# Patient Record
Sex: Male | Born: 1995 | Race: White | Hispanic: No | Marital: Married | Smoking: Current every day smoker
Health system: Southern US, Community
[De-identification: ages and names within clinical notes are randomized; demographics above are authoritative.]

## PROBLEM LIST (undated history)

## (undated) DIAGNOSIS — F32A Depression, unspecified: Secondary | ICD-10-CM

## (undated) DIAGNOSIS — F329 Major depressive disorder, single episode, unspecified: Secondary | ICD-10-CM

## (undated) DIAGNOSIS — T7840XA Allergy, unspecified, initial encounter: Secondary | ICD-10-CM

## (undated) HISTORY — PX: TONSILLECTOMY: SUR1361

## (undated) HISTORY — DX: Major depressive disorder, single episode, unspecified: F32.9

## (undated) HISTORY — DX: Allergy, unspecified, initial encounter: T78.40XA

## (undated) HISTORY — DX: Depression, unspecified: F32.A

---
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
## 2016-06-24 ENCOUNTER — Ambulatory Visit (INDEPENDENT_AMBULATORY_CARE_PROVIDER_SITE_OTHER): Payer: No Typology Code available for payment source | Admitting: Family Medicine

## 2016-06-24 VITALS — BP 110/72 | HR 82 | Temp 98.4°F | Resp 18 | Ht 70.65 in | Wt 188.0 lb

## 2016-06-24 DIAGNOSIS — Z23 Encounter for immunization: Secondary | ICD-10-CM

## 2016-06-24 DIAGNOSIS — N451 Epididymitis: Secondary | ICD-10-CM | POA: Diagnosis not present

## 2016-06-24 DIAGNOSIS — F17200 Nicotine dependence, unspecified, uncomplicated: Secondary | ICD-10-CM | POA: Diagnosis not present

## 2016-06-24 MED ORDER — AZITHROMYCIN 250 MG PO TABS: 1000.0000 mg | ORAL_TABLET | Freq: Once | ORAL | 0 refills | Status: AC

## 2016-06-24 NOTE — Progress Notes (Signed)
    Charles SheffieldBenjamin A Cisneros is a 20 y.o. male who presents to [facility] today for testicle pain. Patient is a several day history of right testicle pain. No fevers chills nausea vomiting or diarrhea. Patient denies any penile discharge. He denies any unprotected sex but does note he had rough sex a few days before the pain started. He feels well otherwise. No treatment tried yet.   Past Medical History:  Diagnosis Date  . Allergy   . Depression    Past Surgical History:  Procedure Laterality Date  . TONSILLECTOMY     Social History  Substance Use Topics  . Smoking status: Light Tobacco Smoker  . Smokeless tobacco: Never Used  . Alcohol use Yes   ROS as above Medications: Current Outpatient Prescriptions  Medication Sig Dispense Refill  . azithromycin (ZITHROMAX) 250 MG tablet Take 4 tablets (1,000 mg total) by mouth once. Take first 2 tablets together, then 1 every day until finished. 4 tablet 0   No current facility-administered medications for this visit.    Allergies  Allergen Reactions  . Amoxicillin   . Penicillins   . Cefpodoxime Rash     Exam:  BP 110/72 (BP Location: Right Arm, Patient Position: Sitting, Cuff Size: Small)   Pulse 82   Temp 98.4 F (36.9 C) (Oral)   Resp 18   Ht 5' 10.65" (1.795 m)   Wt 188 lb (85.3 kg)   SpO2 100%   BMI 26.48 kg/m  Gen: Well NAD HEENT: EOMI,  MMM Lungs: Normal work of breathing. CTABL Heart: RRR no MRG Abd: NABS, Soft. Nondistended, Nontender Exts: Brisk capillary refill, warm and well perfused.  Genitals: No inguinal lymphadenopathy. Testicles are descended bilaterally. The right testicle is nontender with no masses. The left testicle is nontender with no masses. The right epididymis is slightly swollen and tender. The left is nontender. No inguinal hernias are present bilaterally. The penis is circumcised with no lesions or discharge.  No results found for this or any previous visit (from the past 24 hour(s)). No results  found.  Assessment and Plan: 20 y.o. male with epididymitis. Patient is allergic to both penicillins and cephalosporins. GC chlamydia urine test pending. Impaired treatment with amoxicillin 1 g by mouth once.  Additionally patient is a smoker. We discussed smoking cessation.  Orders Placed This Encounter  Procedures  . GC/Chlamydia Probe Amp    Order Specific Question:   Source    Answer:   urine  . Tdap vaccine greater than or equal to 7yo IM     Discussed warning signs or symptoms. Please see discharge instructions. Patient expresses understanding.

## 2016-06-24 NOTE — Patient Instructions (Addendum)
Thank you for coming in today. Return for recheck if needed.   Follow up with me Dr Docia Chuckorey   [facility] Address: [address], [place], [place] [address] Phone: [phone]   Epididymitis Epididymitis is swelling (inflammation) of the epididymis. The epididymis is a cord-like structure that is located along the top and back part of the testicle. It collects and stores sperm from the testicle. This condition can also cause pain and swelling of the testicle and scrotum. Symptoms usually start suddenly (acute epididymitis). Sometimes epididymitis starts gradually and lasts for a while (chronic epididymitis). This type may be harder to treat. CAUSES In men 8535 and younger, this condition is usually caused by a bacterial infection or sexually transmitted disease (STD), such as:  Gonorrhea.  Chlamydia.  In men 4835 and older who do not have anal sex, this condition is usually caused by bacteria from a blockage or abnormalities in the urinary system. These can result from:  Having a tube placed into the bladder (urinary catheter).  Having an enlarged or inflamed prostate gland.  Having recent urinary tract surgery. In men who have a condition that weakens the body's defense system (immune system), such as HIV, this condition can be caused by:   Other bacteria, including tuberculosis and syphilis.  Viruses.  Fungi. Sometimes this condition occurs without infection. That may happen if urine flows backward into the epididymis after heavy lifting or straining. RISK FACTORS This condition is more likely to develop in men:  Who have unprotected sex with more than one partner.  Who have anal sex.   Who have recently had surgery.   Who have a urinary catheter.  Who have urinary problems.  Who have a suppressed immune system. SYMPTOMS  This condition usually begins suddenly with chills, fever, and pain behind the scrotum and in the testicle. Other symptoms  include:   Swelling of the scrotum, testicle, or both.  Pain whenejaculatingor urinating.  Pain in the back or belly.  Nausea.  Itching and discharge from the penis.  Frequent need to pass urine.  Redness and tenderness of the scrotum. DIAGNOSIS Your health care provider can diagnose this condition based on your symptoms and medical history. Your health care provider will also do a physical exam to ask about your symptoms and check your scrotum and testicle for swelling, pain, and redness. You may also have other tests, including:   Examination of discharge from the penis.  Urine tests for infections, such as STDs.  Your health care provider may test you for other STDs, including HIV. TREATMENT Treatment for this condition depends on the cause. If your condition is caused by a bacterial infection, oral antibiotic medicine may be prescribed. If the bacterial infection has spread to your blood, you may need to receive IV antibiotics. Nonbacterial epididymitis is treated with home care that includes bed rest and elevation of the scrotum. Surgery may be needed to treat:  Bacterial epididymitis that causes pus to build up in the scrotum (abscess).  Chronic epididymitis that has not responded to other treatments. HOME CARE INSTRUCTIONS Medicines  Take over-the-counter and prescription medicines only as told by your health care provider.   If you were prescribed an antibiotic medicine, take it as told by your health care provider. Do not stop taking the antibiotic even if your condition improves. Sexual Activity  If your epididymitis was caused by an STD, avoid sexual activity until your treatment is complete.  Inform your sexual partner or partners if you  test positive for an STD. They may need to be treated.Do not engage in sexual activity with your partner or partners until their treatment is completed. General Instructions  Return to your normal activities as told by  your health care provider. Ask your health care provider what activities are safe for you.  Keep your scrotum elevated and supported while resting. Ask your health care provider if you should wear a scrotal support, such as a jockstrap. Wear it as told by your health care provider.  If directed, apply ice to the affected area:   Put ice in a plastic bag.  Place a towel between your skin and the bag.  Leave the ice on for 20 minutes, 2-3 times per day.  Try taking a sitz bath to help with discomfort. This is a warm water bath that is taken while you are sitting down. The water should only come up to your hips and should cover your buttocks. Do this 3-4 times per day or as told by your health care provider.  Keep all follow-up visits as told by your health care provider. This is important. SEEK MEDICAL CARE IF:   You have a fever.   Your pain medicine is not helping.   Your pain is getting worse.   Your symptoms do not improve within three days.   This information is not intended to replace advice given to you by your health care provider. Make sure you discuss any questions you have with your health care provider.   Document Released: 08/19/2000 Document Revised: 05/13/2015 Document Reviewed: 01/07/2015 Elsevier Interactive Patient Education 2016 [employer].     IF you received an x-ray today, you will receive an invoice from [facility]. Please contact [facility] at [phone] with questions or concerns regarding your invoice.   IF you received labwork today, you will receive an invoice from [employer]. Please contact [facility] at [phone] with questions or concerns regarding your invoice.   Our billing staff will not be able to assist you with questions regarding bills from these companies.  You will be contacted with the lab results as soon as they are available. The fastest way to get your results is to activate your  My Chart account. Instructions are located on the last page of this paperwork. If you have not heard from [place] regarding the results in 2 weeks, please contact this office.

## 2016-06-27 LAB — GC/CHLAMYDIA PROBE AMP
CT Probe RNA: NOT DETECTED
GC Probe RNA: NOT DETECTED

## 2016-07-16 ENCOUNTER — Ambulatory Visit (INDEPENDENT_AMBULATORY_CARE_PROVIDER_SITE_OTHER): Payer: No Typology Code available for payment source | Admitting: Family Medicine

## 2016-07-16 VITALS — BP 112/76 | HR 78 | Temp 98.7°F | Resp 18 | Ht 70.0 in | Wt 188.0 lb

## 2016-07-16 DIAGNOSIS — J019 Acute sinusitis, unspecified: Secondary | ICD-10-CM | POA: Diagnosis not present

## 2016-07-16 MED ORDER — AZITHROMYCIN 250 MG PO TABS: ORAL_TABLET | ORAL | 0 refills | Status: DC

## 2016-07-16 MED ORDER — HYDROCODONE-HOMATROPINE 5-1.5 MG/5ML PO SYRP: 5.0000 mL | ORAL_SOLUTION | Freq: Three times a day (TID) | ORAL | 0 refills | Status: DC | PRN

## 2016-07-16 NOTE — Progress Notes (Signed)
Subjective:    Patient ID: Margaretha SheffieldBenjamin A Paxton, male    DOB: [birth year], 20 y.o.   MRN: [id number] Chief Complaint  Patient presents with  . Nasal Congestion    x 5 days  . Cough  . Chills    HPI  Romeo AppleBen is a 20 yo with 5d of worsening URI sxs.  He began to feel like he was dying with congestion and initial improvement followed by secondary worsening.  He is using sudafed and a netti pot with partial relief.  He has had some low grade fevers.     Left ear pain began last night and had sig sinus pressure over the frontal sinuses.  Dep[facility] screen [facility] 06/24/2016 06/01/2015  Decreased Interest 0 0  Down, Depressed, Hopeless 0 1  PHQ - 2 Score 0 1   Past Medical History:  Diagnosis Date  . Allergy   . Depression    Past Surgical History:  Procedure Laterality Date  . TONSILLECTOMY     No current outpatient prescriptions on file prior to visit.   No current facility-administered medications on file prior to visit.    Allergies  Allergen Reactions  . Loratadine Shortness Of Breath  . Amoxicillin   . Penicillins   . Cefpodoxime Rash   No family history on file. Social History   Social History  . Marital status: Single    Spouse name: N/A  . Number of children: N/A  . Years of education: N/A   Social History Main Topics  . Smoking status: Light Tobacco Smoker  . Smokeless tobacco: Never Used  . Alcohol use Yes  . Drug use: No  . Sexual activity: Not Asked   Other Topics Concern  . None   Social History Narrative  . None     Review of Systems  Constitutional: Positive for activity change, appetite change, chills, diaphoresis, fatigue and fever. Negative for unexpected weight change.  HENT: Positive for congestion, ear pain, postnasal drip, rhinorrhea, sinus pressure and sore throat. Negative for mouth sores.   Respiratory: Positive for cough. Negative for shortness of breath.   Cardiovascular: Negative for chest pain.  Gastrointestinal: Negative for  abdominal pain, nausea and vomiting.  Genitourinary: Negative for dysuria.  Musculoskeletal: Positive for myalgias. Negative for arthralgias, neck pain and neck stiffness.  Skin: Negative for rash.  Neurological: Positive for headaches. Negative for syncope.  Hematological: Positive for adenopathy.  Psychiatric/Behavioral: Positive for sleep disturbance. Negative for dysphoric mood. The patient is not nervous/anxious.        Objective:   Physical Exam  Constitutional: He is oriented to person, place, and time. He appears well-developed and well-nourished. He appears ill. No distress.  HENT:  Head: Normocephalic and atraumatic.  Right Ear: External ear and ear canal normal. Tympanic membrane is erythematous.  Left Ear: External ear and ear canal normal. Tympanic membrane is erythematous and bulging.  Nose: Rhinorrhea present. No mucosal edema. Right sinus exhibits frontal sinus tenderness. Right sinus exhibits no maxillary sinus tenderness. Left sinus exhibits frontal sinus tenderness. Left sinus exhibits no maxillary sinus tenderness.  Mouth/Throat: Uvula is midline and mucous membranes are normal. Posterior oropharyngeal erythema present. No oropharyngeal exudate or posterior oropharyngeal edema.  Eyes: Conjunctivae are normal. Right eye exhibits no discharge. Left eye exhibits no discharge. No scleral icterus.  Neck: Normal range of motion. Neck supple. No thyromegaly present.  Cardiovascular: Normal rate, regular rhythm, normal heart sounds and intact distal pulses.   Pulmonary/Chest: Effort normal and breath sounds  normal. No respiratory distress.  Lymphadenopathy:       Head (right side): No submandibular adenopathy present.       Head (left side): No submandibular adenopathy present.    He has no cervical adenopathy.       Right: No supraclavicular adenopathy present.       Left: No supraclavicular adenopathy present.  Neurological: He is alert and oriented to person, place, and  time.  Skin: Skin is warm and dry. He is not diaphoretic. No erythema.  Psychiatric: He has a normal mood and affect. His behavior is normal.    BP 112/76   Pulse 78   Temp 98.7 F (37.1 C) (Oral)   Resp 18   Ht 5\' 10"  (1.778 m)   Wt 188 lb (85.3 kg)   SpO2 100%   BMI 26.98 kg/m      Assessment & Plan:   1. Acute non-recurrent sinusitis, unspecified location   Cont sudafed and netti pot  Meds ordered this encounter  Medications  . Pseudoephedrine HCl (SUDAFED 12 HOUR PO)    Sig: Take by mouth.  Marland Kitchen. azithromycin (ZITHROMAX) 250 MG tablet    Sig: Take 2 tabs PO x 1 dose, then 1 tab PO QD x 4 days    Dispense:  6 tablet    Refill:  0  . HYDROcodone-homatropine (HYCODAN) 5-1.5 MG/5ML syrup    Sig: Take 5 mLs by mouth every 8 (eight) hours as needed for cough.    Dispense:  120 mL    Refill:  0     Norberto SorensonEva Kailene Steinhart, M.D.  [facility]  [facility] [address] [place], [place] [address] [phone] phone [phone] fax  07/16/16 4:46 PM

## 2016-07-16 NOTE — Patient Instructions (Addendum)
   IF you received an x-ray today, you will receive an invoice from [facility]. Please contact [facility] at [phone] with questions or concerns regarding your invoice.   IF you received labwork today, you will receive an invoice from [facility]. Please contact [facility] at [phone] with questions or concerns regarding your invoice.   Our billing staff will not be able to assist you with questions regarding bills from these companies.  You will be contacted with the lab results as soon as they are available. The fastest way to get your results is to activate your My Chart account. Instructions are located on the last page of this paperwork. If you have not heard from us regarding the results in 2 weeks, please contact this office.     Sinusitis, Adult Sinusitis is redness, soreness, and inflammation of the paranasal sinuses. Paranasal sinuses are air pockets within the bones of your face. They are located beneath your eyes, in the middle of your forehead, and above your eyes. In healthy paranasal sinuses, mucus is able to drain out, and air is able to circulate through them by way of your nose. However, when your paranasal sinuses are inflamed, mucus and air can become trapped. This can allow bacteria and other germs to grow and cause infection. Sinusitis can develop quickly and last only a short time (acute) or continue over a long period (chronic). Sinusitis that lasts for more than 12 weeks is considered chronic. CAUSES Causes of sinusitis include:  Allergies.  Structural abnormalities, such as displacement of the cartilage that separates your nostrils (deviated septum), which can decrease the air flow through your nose and sinuses and affect sinus drainage.  Functional abnormalities, such as when the small hairs (cilia) that line your sinuses and help remove mucus do not work properly or are not present. SIGNS AND SYMPTOMS Symptoms  of acute and chronic sinusitis are the same. The primary symptoms are pain and pressure around the affected sinuses. Other symptoms include:  Upper toothache.  Earache.  Headache.  Bad breath.  Decreased sense of smell and taste.  A cough, which worsens when you are lying flat.  Fatigue.  Fever.  Thick drainage from your nose, which often is green and may contain pus (purulent).  Swelling and warmth over the affected sinuses. DIAGNOSIS Your health care provider will perform a physical exam. During your exam, your health care provider may perform any of the following to help determine if you have acute sinusitis or chronic sinusitis:  Look in your nose for signs of abnormal growths in your nostrils (nasal polyps).  Tap over the affected sinus to check for signs of infection.  View the inside of your sinuses using an imaging device that has a light attached (endoscope). If your health care provider suspects that you have chronic sinusitis, one or more of the following tests may be recommended:  Allergy tests.  Nasal culture. A sample of mucus is taken from your nose, sent to a lab, and screened for bacteria.  Nasal cytology. A sample of mucus is taken from your nose and examined by your health care provider to determine if your sinusitis is related to an allergy. TREATMENT Most cases of acute sinusitis are related to a viral infection and will resolve on their own within 10 days. Sometimes, medicines are prescribed to help relieve symptoms of both acute and chronic sinusitis. These may include pain medicines, decongestants, nasal steroid sprays, or saline sprays. However, for sinusitis related   to a bacterial infection, your health care provider will prescribe antibiotic medicines. These are medicines that will help kill the bacteria causing the infection. Rarely, sinusitis is caused by a fungal infection. In these cases, your health care provider will prescribe antifungal  medicine. For some cases of chronic sinusitis, surgery is needed. Generally, these are cases in which sinusitis recurs more than 3 times per year, despite other treatments. HOME CARE INSTRUCTIONS  Drink plenty of water. Water helps thin the mucus so your sinuses can drain more easily.  Use a humidifier.  Inhale steam 3-4 times a day (for example, sit in the bathroom with the shower running).  Apply a warm, moist washcloth to your face 3-4 times a day, or as directed by your health care provider.  Use saline nasal sprays to help moisten and clean your sinuses.  Take medicines only as directed by your health care provider.  If you were prescribed either an antibiotic or antifungal medicine, finish it all even if you start to feel better. SEEK IMMEDIATE MEDICAL CARE IF:  You have increasing pain or severe headaches.  You have nausea, vomiting, or drowsiness.  You have swelling around your face.  You have vision problems.  You have a stiff neck.  You have difficulty breathing.   This information is not intended to replace advice given to you by your health care provider. Make sure you discuss any questions you have with your health care provider.   Document Released: 08/22/2005 Document Revised: 09/12/2014 Document Reviewed: 09/06/2011 Elsevier Interactive Patient Education 2016 Elsevier Inc.  

## 2017-08-22 ENCOUNTER — Encounter: Payer: Self-pay | Admitting: Emergency Medicine

## 2017-08-22 ENCOUNTER — Ambulatory Visit: Payer: No Typology Code available for payment source | Admitting: Emergency Medicine

## 2017-08-22 VITALS — BP 122/72 | HR 59 | Temp 97.3°F | Resp 18 | Ht 71.5 in | Wt 199.0 lb

## 2017-08-22 DIAGNOSIS — J069 Acute upper respiratory infection, unspecified: Secondary | ICD-10-CM | POA: Diagnosis not present

## 2017-08-22 DIAGNOSIS — R0981 Nasal congestion: Secondary | ICD-10-CM | POA: Insufficient documentation

## 2017-08-22 MED ORDER — TRIAMCINOLONE ACETONIDE 55 MCG/ACT NA AERO: 2.0000 | INHALATION_SPRAY | Freq: Every day | NASAL | 12 refills | Status: DC

## 2017-08-22 MED ORDER — PSEUDOEPHEDRINE-GUAIFENESIN ER 60-600 MG PO TB12: 1.0000 | ORAL_TABLET | Freq: Two times a day (BID) | ORAL | 1 refills | Status: AC

## 2017-08-22 NOTE — Progress Notes (Signed)
Charles Cisneros 21 y.o.   Chief Complaint  Patient presents with  . Sinusitis    HISTORY OF PRESENT ILLNESS: This is a 21 y.o. male complaining of 3 day h/o nasal congestion, mild headaches, sore throat; works outdoors; has been taking DayQuil and NyQuil; no other significant symptoms.  Sinus Problem  This is a new problem. The current episode started in the past 7 days. The problem has been waxing and waning since onset. There has been no fever. The pain is mild. Associated symptoms include congestion, coughing, headaches, sinus pressure and a sore throat. Pertinent negatives include no chills, ear pain, neck pain, shortness of breath, sneezing or swollen glands.     Prior to Admission medications   Not on File    Allergies  Allergen Reactions  . Loratadine Shortness Of Breath  . Amoxicillin   . Penicillins   . Cefpodoxime Rash    Patient Active Problem List   Diagnosis Date Noted  . Epididymitis 06/24/2016  . Smoker 06/24/2016    Past Medical History:  Diagnosis Date  . Allergy   . Depression     Past Surgical History:  Procedure Laterality Date  . TONSILLECTOMY      Social History   Socioeconomic History  . Marital status: Single    Spouse name: Not on file  . Number of children: Not on file  . Years of education: Not on file  . Highest education level: Not on file  Social Needs  . Financial resource strain: Not on file  . Food insecurity - worry: Not on file  . Food insecurity - inability: Not on file  . Transportation needs - medical: Not on file  . Transportation needs - non-medical: Not on file  Occupational History  . Not on file  Tobacco Use  . Smoking status: Light Tobacco Smoker  . Smokeless tobacco: Never Used  Substance and Sexual Activity  . Alcohol use: Yes  . Drug use: No  . Sexual activity: Not on file  Other Topics Concern  . Not on file  Social History Narrative  . Not on file    No family history on file.   Review of  Systems  Constitutional: Negative for chills and fever.  HENT: Positive for congestion, sinus pressure and sore throat. Negative for ear pain, nosebleeds and sneezing.   Eyes: Negative.  Negative for discharge and redness.  Respiratory: Positive for cough. Negative for hemoptysis, sputum production, shortness of breath and wheezing.   Cardiovascular: Negative.  Negative for chest pain and palpitations.  Gastrointestinal: Negative.  Negative for abdominal pain, diarrhea, nausea and vomiting.  Genitourinary: Negative.  Negative for hematuria.  Musculoskeletal: Negative.  Negative for back pain, myalgias and neck pain.  Neurological: Positive for headaches.  Endo/Heme/Allergies: Negative.   All other systems reviewed and are negative.   Vitals:   08/22/17 1417  BP: 122/72  Pulse: (!) 59  Resp: 18  Temp: (!) 97.3 F (36.3 C)  SpO2: 98%    Physical Exam  Constitutional: He is oriented to person, place, and time. He appears well-developed and well-nourished.  HENT:  Head: Normocephalic and atraumatic.  Right Ear: External ear normal.  Left Ear: External ear normal.  Nose: Mucosal edema present.  Mouth/Throat: Oropharynx is clear and moist.  Eyes: Conjunctivae and EOM are normal. Pupils are equal, round, and reactive to light.  Neck: Normal range of motion. No JVD present.  Cardiovascular: Normal rate, regular rhythm and normal heart sounds.  Pulmonary/Chest: Effort  normal and breath sounds normal.  Abdominal: Soft. Bowel sounds are normal. He exhibits no distension. There is no tenderness.  Musculoskeletal: Normal range of motion.  Lymphadenopathy:    He has no cervical adenopathy.  Neurological: He is alert and oriented to person, place, and time. No sensory deficit. He exhibits normal muscle tone.  Skin: Skin is warm and dry. Capillary refill takes less than 2 seconds. No rash noted.  Psychiatric: He has a normal mood and affect. His behavior is normal.  Vitals  reviewed.    ASSESSMENT & PLAN: Kalvyn was seen today for sinusitis.  Diagnoses and all orders for this visit:  Sinus congestion -     triamcinolone (NASACORT) 55 MCG/ACT AERO nasal inhaler; Place 2 sprays into the nose daily. -     pseudoephedrine-guaifenesin (MUCINEX D) 60-600 MG 12 hr tablet; Take 1 tablet by mouth every 12 (twelve) hours for 5 days.  Acute upper respiratory infection     Patient Instructions       IF you received an x-ray today, you will receive an invoice from [facility]. Please contact [facility] at [phone] with questions or concerns regarding your invoice.   IF you received labwork today, you will receive an invoice from [facility]. Please contact [facility] at [phone] with questions or concerns regarding your invoice.   Our billing staff will not be able to assist you with questions regarding bills from these companies.  You will be contacted with the lab results as soon as they are available. The fastest way to get your results is to activate your My Chart account. Instructions are located on the last page of this paperwork. If you have not heard from [place] regarding the results in 2 weeks, please contact this office.      Upper Respiratory Infection, Adult Most upper respiratory infections (URIs) are caused by a virus. A URI affects the nose, throat, and upper air passages. The most common type of URI is often called "the common cold." Follow these instructions at home:  Take medicines only as told by your doctor.  Gargle warm saltwater or take cough drops to comfort your throat as told by your doctor.  Use a warm mist humidifier or inhale steam from a shower to increase air moisture. This may make it easier to breathe.  Drink enough fluid to keep your pee (urine) clear or pale yellow.  Eat soups and other clear broths.  Have a healthy diet.  Rest as needed.  Go back to work when your fever is gone or your doctor  says it is okay. ? You may need to stay home longer to avoid giving your URI to others. ? You can also wear a face mask and wash your hands often to prevent spread of the virus.  Use your inhaler more if you have asthma.  Do not use any tobacco products, including cigarettes, chewing tobacco, or electronic cigarettes. If you need help quitting, ask your doctor. Contact a doctor if:  You are getting worse, not better.  Your symptoms are not helped by medicine.  You have chills.  You are getting more short of breath.  You have brown or red mucus.  You have yellow or brown discharge from your nose.  You have pain in your face, especially when you bend forward.  You have a fever.  You have puffy (swollen) neck glands.  You have pain while swallowing.  You have white areas in the back of your throat. Get help  right away if:  You have very bad or constant: ? Headache. ? Ear pain. ? Pain in your forehead, behind your eyes, and over your cheekbones (sinus pain). ? Chest pain.  You have long-lasting (chronic) lung disease and any of the following: ? Wheezing. ? Long-lasting cough. ? Coughing up blood. ? A change in your usual mucus.  You have a stiff neck.  You have changes in your: ? Vision. ? Hearing. ? Thinking. ? Mood. This information is not intended to replace advice given to you by your health care provider. Make sure you discuss any questions you have with your health care provider. Document Released: 02/08/2008 Document Revised: 04/24/2016 Document Reviewed: 11/27/2013 Elsevier Interactive Patient Education  2018 Elsevier Inc.      Edwina BarthMiguel Gloriajean Okun, MD [facility]

## 2017-08-22 NOTE — Patient Instructions (Addendum)
     IF you received an x-ray today, you will receive an invoice from [facility]. Please contact [facility] at [phone] with questions or concerns regarding your invoice.   IF you received labwork today, you will receive an invoice from [facility]. Please contact [facility] at [phone] with questions or concerns regarding your invoice.   Our billing staff will not be able to assist you with questions regarding bills from these companies.  You will be contacted with the lab results as soon as they are available. The fastest way to get your results is to activate your My Chart account. Instructions are located on the last page of this paperwork. If you have not heard from us regarding the results in 2 weeks, please contact this office.     Upper Respiratory Infection, Adult Most upper respiratory infections (URIs) are caused by a virus. A URI affects the nose, throat, and upper air passages. The most common type of URI is often called "the common cold." Follow these instructions at home:  Take medicines only as told by your doctor.  Gargle warm saltwater or take cough drops to comfort your throat as told by your doctor.  Use a warm mist humidifier or inhale steam from a shower to increase air moisture. This may make it easier to breathe.  Drink enough fluid to keep your pee (urine) clear or pale yellow.  Eat soups and other clear broths.  Have a healthy diet.  Rest as needed.  Go back to work when your fever is gone or your doctor says it is okay. ? You may need to stay home longer to avoid giving your URI to others. ? You can also wear a face mask and wash your hands often to prevent spread of the virus.  Use your inhaler more if you have asthma.  Do not use any tobacco products, including cigarettes, chewing tobacco, or electronic cigarettes. If you need help quitting, ask your doctor. Contact a doctor if:  You are getting worse, not better.  Your  symptoms are not helped by medicine.  You have chills.  You are getting more short of breath.  You have brown or red mucus.  You have yellow or brown discharge from your nose.  You have pain in your face, especially when you bend forward.  You have a fever.  You have puffy (swollen) neck glands.  You have pain while swallowing.  You have white areas in the back of your throat. Get help right away if:  You have very bad or constant: ? Headache. ? Ear pain. ? Pain in your forehead, behind your eyes, and over your cheekbones (sinus pain). ? Chest pain.  You have long-lasting (chronic) lung disease and any of the following: ? Wheezing. ? Long-lasting cough. ? Coughing up blood. ? A change in your usual mucus.  You have a stiff neck.  You have changes in your: ? Vision. ? Hearing. ? Thinking. ? Mood. This information is not intended to replace advice given to you by your health care provider. Make sure you discuss any questions you have with your health care provider. Document Released: 02/08/2008 Document Revised: 04/24/2016 Document Reviewed: 11/27/2013 Elsevier Interactive Patient Education  2018 Elsevier Inc.  

## 2018-03-30 ENCOUNTER — Emergency Department (HOSPITAL_COMMUNITY)
Admission: EM | Admit: 2018-03-30 | Discharge: 2018-03-30 | Disposition: A | Discharge status: Home / Self Care | Payer: No Typology Code available for payment source | Attending: Emergency Medicine | Admitting: Emergency Medicine

## 2018-03-30 ENCOUNTER — Encounter (HOSPITAL_COMMUNITY): Payer: Self-pay

## 2018-03-30 ENCOUNTER — Emergency Department (HOSPITAL_COMMUNITY): Payer: No Typology Code available for payment source

## 2018-03-30 DIAGNOSIS — Y9389 Activity, other specified: Secondary | ICD-10-CM | POA: Insufficient documentation

## 2018-03-30 DIAGNOSIS — Y929 Unspecified place or not applicable: Secondary | ICD-10-CM | POA: Diagnosis not present

## 2018-03-30 DIAGNOSIS — S71122A Laceration with foreign body, left thigh, initial encounter: Secondary | ICD-10-CM | POA: Diagnosis not present

## 2018-03-30 DIAGNOSIS — Y990 Civilian activity done for income or pay: Secondary | ICD-10-CM | POA: Diagnosis not present

## 2018-03-30 DIAGNOSIS — S8012XA Contusion of left lower leg, initial encounter: Secondary | ICD-10-CM

## 2018-03-30 DIAGNOSIS — S81812A Laceration without foreign body, left lower leg, initial encounter: Secondary | ICD-10-CM

## 2018-03-30 MED ORDER — HYDROMORPHONE HCL 1 MG/ML IJ SOLN
1.0000 mg | Freq: Once | INTRAMUSCULAR | Status: AC
  Administered 2018-03-30: 1 mg via INTRAVENOUS
  Filled 2018-03-30: qty 1

## 2018-03-30 MED ORDER — CEFAZOLIN SODIUM-DEXTROSE 1-4 GM/50ML-% IV SOLN
1.0000 g | Freq: Once | INTRAVENOUS | Status: AC
  Administered 2018-03-30: 1 g via INTRAVENOUS
  Filled 2018-03-30: qty 50

## 2018-03-30 MED ORDER — CEPHALEXIN 500 MG PO CAPS: 500.0000 mg | ORAL_CAPSULE | Freq: Four times a day (QID) | ORAL | 0 refills | Status: DC

## 2018-03-30 MED ORDER — LIDOCAINE-EPINEPHRINE (PF) 2 %-1:200000 IJ SOLN
20.0000 mL | Freq: Once | INTRAMUSCULAR | Status: AC
  Administered 2018-03-30: 20 mL
  Filled 2018-03-30: qty 20

## 2018-03-30 NOTE — Discharge Instructions (Addendum)
It was our pleasure to provide your ER care today - we hope that you feel better.  Keep wound very clean and dry.   Do not submerge wound (I.e. Do not take bath or swim) until after sutures are out and wound is completely healed. May shower, pad area gently dry.   Ice/coldpack to sore area.   Take motrin or aleve as need for pain.   Take keflex (antibiotic) as prescribed.  Follow up with primary care doctor, or urgent care, for suture removal in 2 weeks time.   Return to ER if worse, spreading redness, severe pain, fevers, infection of wound, other concern.   You were given pain medication in the ER - no driving for the next 6 hours.

## 2018-03-30 NOTE — ED Notes (Signed)
Pt stable, states understanding of discharge instructions, ambulatory

## 2018-03-30 NOTE — ED Provider Notes (Addendum)
[facility]   CSN: [id number] Arrival date & time: 03/30/18  1734     History   Chief Complaint Chief Complaint  Patient presents with  . Trauma    HPI Ernst SpellBenjamin Lundy is a 22 y.o. male.  Patient c/o trauma/injury to left upper leg just pta today. Was at work. Fork lift hit posterior aspect left leg. Laceration to area. Tetanus within past 1-2 years. Pain constant, severe, non radiating, worse w palpation and movement. No numbness/weakness. Was not pinned for any length of time. Denies any other pain or injury.   The history is provided by the patient and the EMS personnel.    No past medical history on file.  There are no active problems to display for this patient.   Pmh: no hx htn, dm.   Meds: none  Allergy: pcn  Home Medications    Prior to Admission medications   Not on File    Family History No family history on file.  Social History Social History   Tobacco Use  . Smoking status: Not on file  Substance Use Topics  . Alcohol use: Not on file  . Drug use: Not on file     Allergies   Patient has no allergy information on record.   Review of Systems Review of Systems  Constitutional: Negative for fever.  HENT: Negative for nosebleeds.   Eyes: Negative for pain.  Respiratory: Negative for shortness of breath.   Cardiovascular: Negative for chest pain.  Gastrointestinal: Negative for abdominal pain and vomiting.  Genitourinary: Negative for flank pain.  Musculoskeletal: Negative for back pain and neck pain.  Skin: Positive for wound.  Neurological: Negative for weakness, numbness and headaches.  Hematological: Does not bruise/bleed easily.  Psychiatric/Behavioral: Negative for confusion.     Physical Exam Updated Vital Signs There were no vitals taken for this visit.  Physical Exam  Constitutional: He appears well-developed and well-nourished.  HENT:  Head: Atraumatic.    Mouth/Throat: Oropharynx is clear and moist.  Eyes: Pupils are equal, round, and reactive to light. Conjunctivae are normal.  Neck: Normal range of motion. Neck supple. No tracheal deviation present.  Cardiovascular: Normal rate, regular rhythm, normal heart sounds and intact distal pulses.  Pulmonary/Chest: Effort normal and breath sounds normal. No accessory muscle usage. No respiratory distress.  Abdominal: Soft. Bowel sounds are normal. He exhibits no distension. There is no tenderness.  Genitourinary:  Genitourinary Comments: Normal external gu exam.   Musculoskeletal: He exhibits no edema.  CTLS spine, non tender, aligned, no step off. Tenderness mid left femur, with large laceration posteriorly. Dp/pt 2+ bil. No other focal bony tenderness.   Neurological: He is alert.  Motor intact bil, stre 5/5. sens grossly intact.   Skin: Skin is warm and dry. No rash noted.  Psychiatric: He has a normal mood and affect.  Nursing note and vitals reviewed.    ED Treatments / Results  Labs (all labs ordered are listed, but only abnormal results are displayed) Labs Reviewed - No data to display  EKG None  Radiology No results found.  Procedures .Marland Kitchen.Laceration Repair Date/Time: 03/30/2018 6:58 PM Performed by: Cathren LaineSteinl, Latonya Nelon, MD Authorized by: Cathren LaineSteinl, Jeania Nater, MD   Anesthesia (see MAR for exact dosages):    Anesthesia method:  Local infiltration   Local anesthetic:  Lidocaine 2% WITH epi Laceration details:    Location:  Leg   Leg location:  L upper leg   Length (cm):  9 Repair  type:    Repair type:  Intermediate Pre-procedure details:    Preparation:  Patient was prepped and draped in usual sterile fashion Exploration:    Wound extent: foreign bodies/material     Foreign bodies/material:  Tiny fbs/?dirt/debris removed Treatment:    Area cleansed with:  Betadine   Amount of cleaning:  Extensive   Irrigation solution:  Sterile saline   Irrigation volume:  1.5 liter    Irrigation method:  Syringe   Visualized foreign bodies/material removed: yes   Subcutaneous repair:    Suture size:  4-0   Suture material:  Vicryl   Suture technique:  Simple interrupted   Number of sutures:  4 Skin repair:    Repair method:  Sutures   Suture size:  3-0   Suture material:  Prolene   Suture technique:  Simple interrupted   Number of sutures:  13 Post-procedure details:    Patient tolerance of procedure:  Tolerated well, no immediate complications   (including critical care time)  Medications Ordered in ED Medications  HYDROmorphone (DILAUDID) injection 1 mg (has no administration in time range)     Initial Impression / Assessment and Plan / ED Course  I have reviewed the triage vital signs and the nursing notes.  Pertinent labs & imaging results that were available during my care of the patient were reviewed by me and considered in my medical decision making (see chart for details).  Iv ns. Xrays. Dilaudid 1 mg iv.   Reviewed nursing notes and prior charts for additional history.   Wound sterile prep and drape - see laceration repair note.   Ancef iv.   Sterile dressing applied.   icepack to area. Pain controlled well with earlier med and local infiltration.   Recheck, no significant or increased swelling to area. Pain relieved. Distal pulses palp.  xrays reviewed - no fx or fb.   Pt currently appears stable for d/c.     Final Clinical Impressions(s) / ED Diagnoses   Final diagnoses:  None    ED Discharge Orders    None        Cathren Laine, MD 03/30/18 1904

## 2018-03-30 NOTE — Progress Notes (Signed)
Orthopedic Tech Progress Note Patient Details:  Charles Cisneros 09/05/1874 [id number]  Patient ID: Charles Cisneros, male   DOB: 09/05/1874, [age] y.o.   MRN: [id number]   [name], [name] 03/30/2018, 5:35 PM Made level 2 trauma visit

## 2018-04-02 ENCOUNTER — Encounter: Payer: Self-pay | Admitting: Emergency Medicine

## 2018-12-16 IMAGING — DX DG FEMUR 2+V*L*
4 series · 4 of 4 positions shown · non-contrast
Comparison: None.

CLINICAL DATA: Acute LEFT leg pain from forklift injury today.
Initial encounter.

EXAM:
LEFT FEMUR 2 VIEWS

[femur ap (1 of 2)]
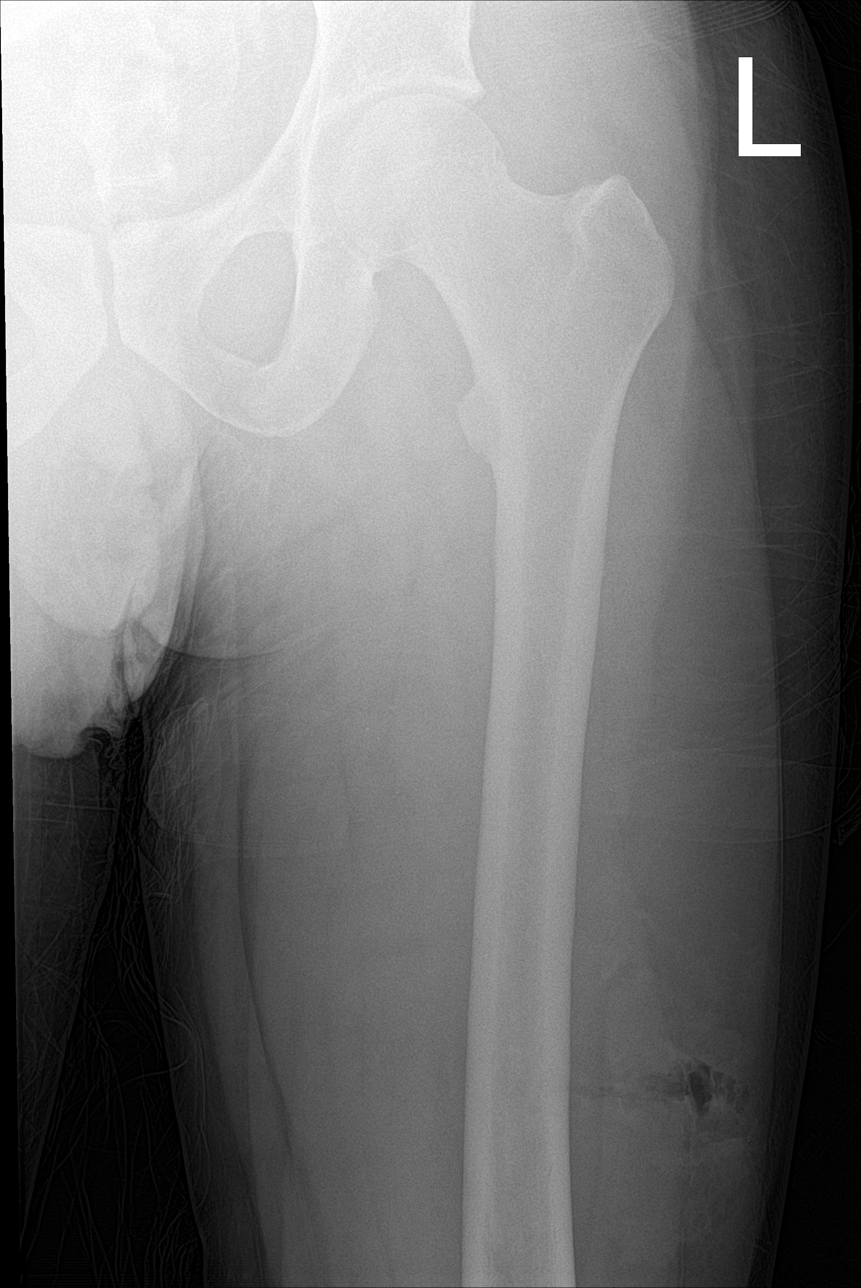

[femur ap (2 of 2)]
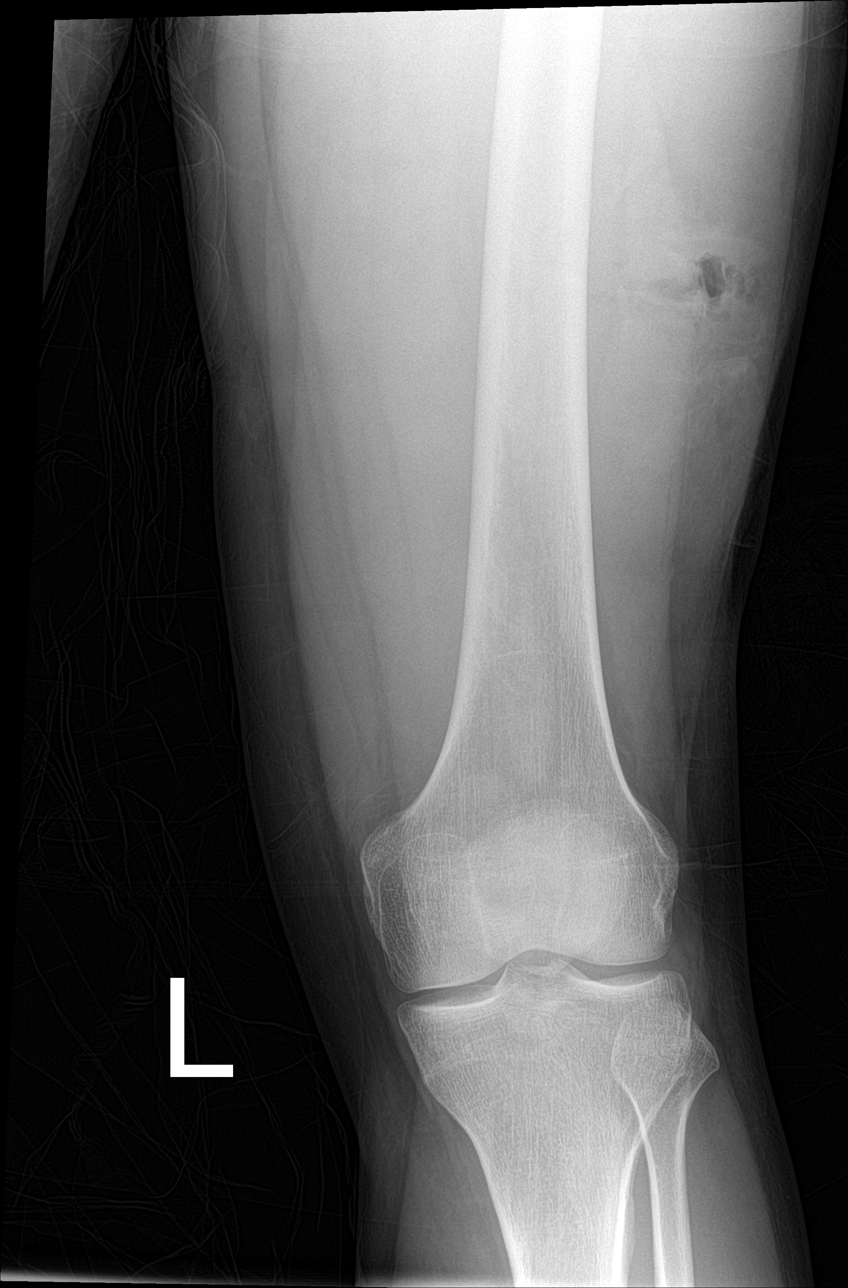

[femur lat (1 of 2)]
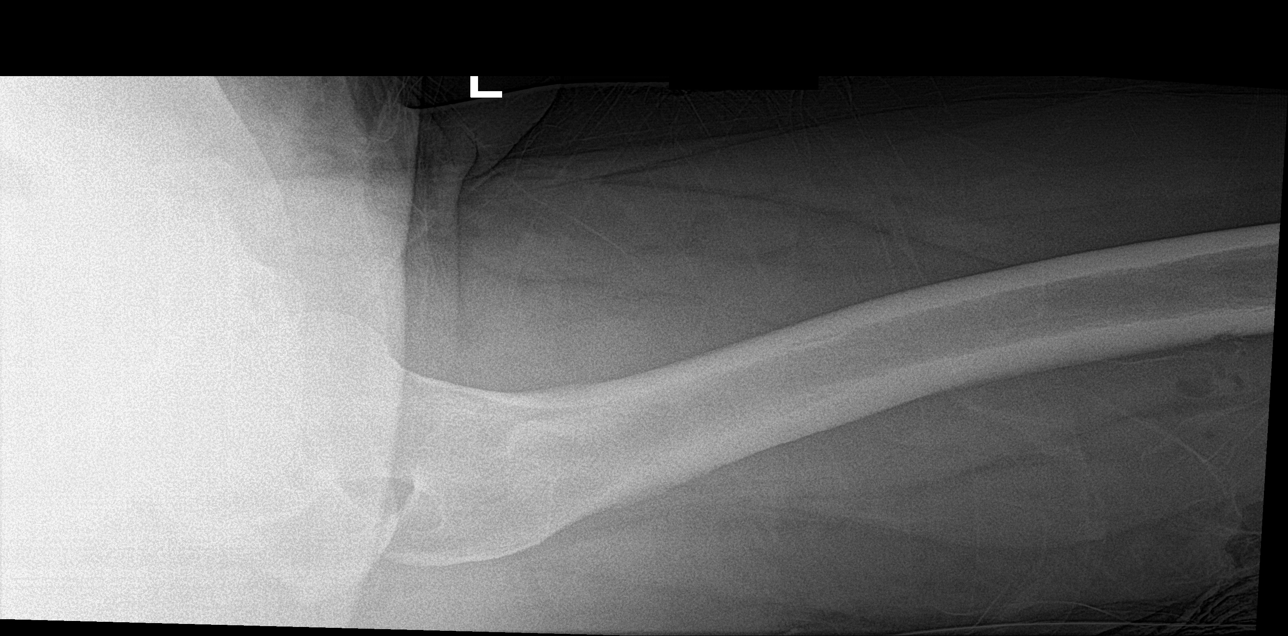

[femur lat (2 of 2)]
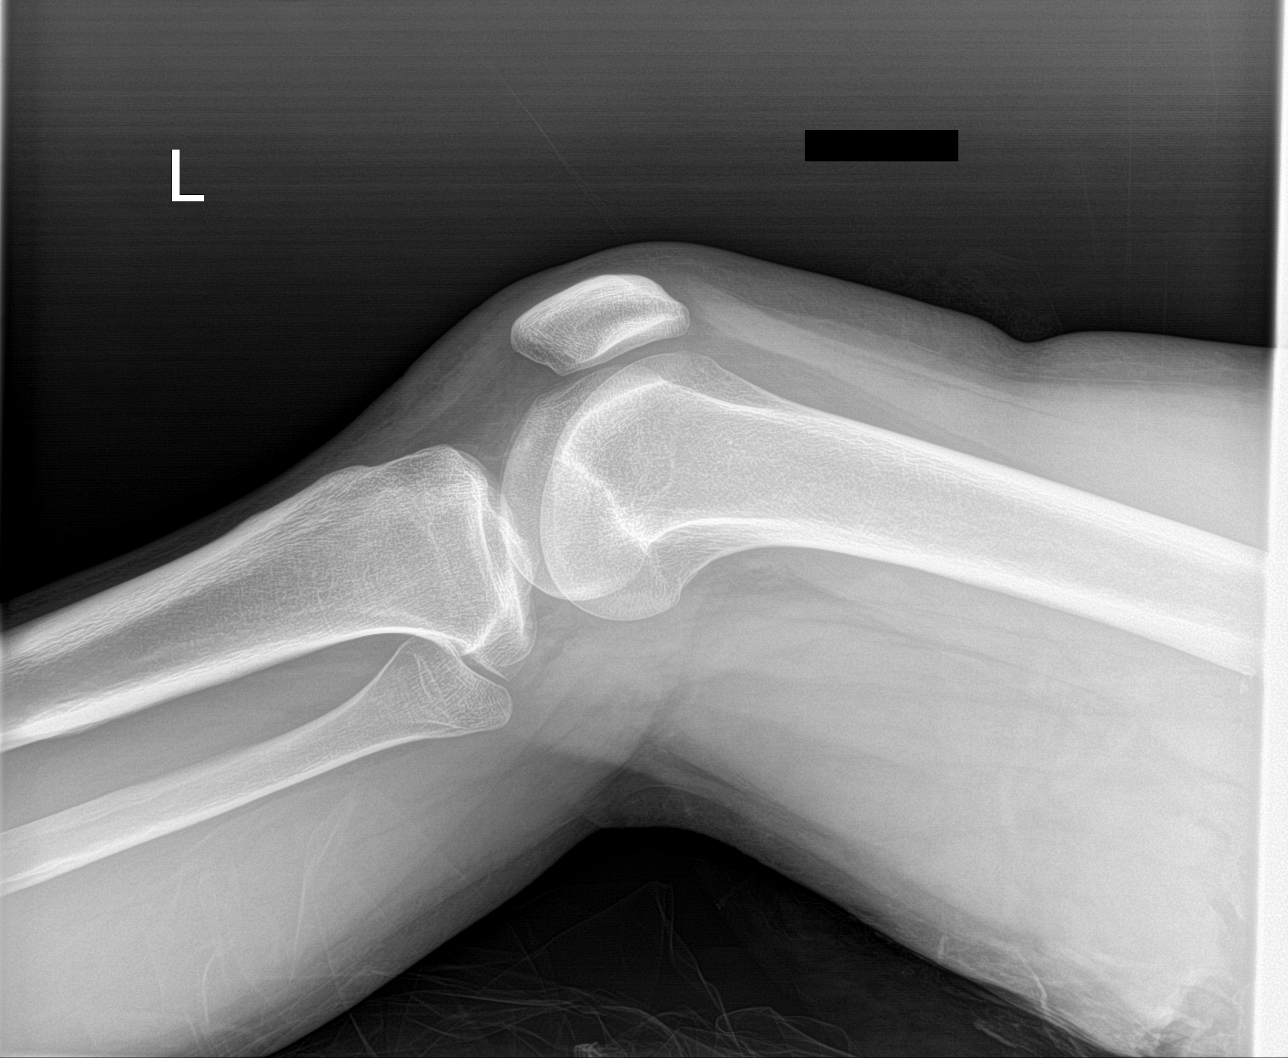

[4 of 4 positions shown; findings below may reference images not displayed]

FINDINGS: Gas in the LATERAL soft tissues of the thigh identified compatible
with soft tissue injury.

No acute fracture, dislocation or radiopaque foreign body
identified.
IMPRESSION: Thigh soft tissue injury without acute bony abnormality or
radiopaque foreign body.

## 2019-08-15 ENCOUNTER — Other Ambulatory Visit: Payer: Self-pay

## 2019-08-15 DIAGNOSIS — Z20822 Contact with and (suspected) exposure to covid-19: Secondary | ICD-10-CM

## 2019-08-17 LAB — NOVEL CORONAVIRUS, NAA: SARS-CoV-2, NAA: DETECTED — AB

## 2024-05-22 ENCOUNTER — Ambulatory Visit
Admission: EM | Admit: 2024-05-22 | Discharge: 2024-05-22 | Disposition: A | Discharge status: Home / Self Care | Attending: Nurse Practitioner | Admitting: Nurse Practitioner

## 2024-05-22 ENCOUNTER — Other Ambulatory Visit: Payer: Self-pay

## 2024-05-22 ENCOUNTER — Encounter: Payer: Self-pay | Admitting: Emergency Medicine

## 2024-05-22 DIAGNOSIS — H6591 Unspecified nonsuppurative otitis media, right ear: Secondary | ICD-10-CM

## 2024-05-22 DIAGNOSIS — J0100 Acute maxillary sinusitis, unspecified: Secondary | ICD-10-CM | POA: Diagnosis not present

## 2024-05-22 MED ORDER — FLUTICASONE PROPIONATE 50 MCG/ACT NA SUSP: 2.0000 | Freq: Every day | NASAL | 0 refills | Status: DC

## 2024-05-22 MED ORDER — AZITHROMYCIN 250 MG PO TABS: 250.0000 mg | ORAL_TABLET | Freq: Every day | ORAL | 0 refills | Status: DC

## 2024-05-22 NOTE — Discharge Instructions (Signed)
 Take medication as directed. Increase fluids and get plenty of rest. May take over-the-counter Ibuprofen or Tylenol as needed for pain, fever, or general discomfort. Recommend normal saline nasal spray to help with nasal congestion throughout the day. For your cough, it may be helpful to use a humidifier at bedtime during sleep. If symptoms fail to improve with this treatment, you may follow-up in this clinic or with your primary care physician for further evaluation. Follow-up as needed.

## 2024-05-22 NOTE — ED Triage Notes (Addendum)
 Pt reports right ear pain since last night. Denies any known injury since pain onset but reports had a concussion a few weeks ago. Denies any known fevers. Has tried ibuprofen, last dose this am.

## 2024-05-22 NOTE — ED Provider Notes (Signed)
 [facility]    CSN: [id number] Arrival date & time: 05/22/24  9166      History   Chief Complaint Chief Complaint  Patient presents with   Ear Pain    HPI Charles Cisneros is a 28 y.o. male.   The history is provided by the patient.   Patient presents with a 1 day history of right ear pain.  He also endorses headache, dizziness, nasal congestion, and sinus pressure that is been present for the past week or so.  Patient denies fever, chills, ear drainage, wheezing, cough, abdominal pain, nausea, vomiting, diarrhea, or rash.  States that he did take ibuprofen last evening.  Past Medical History:  Diagnosis Date   Allergy    Depression     Patient Active Problem List   Diagnosis Date Noted   Acute upper respiratory infection 08/22/2017   Sinus congestion 08/22/2017   Smoker 06/24/2016    Past Surgical History:  Procedure Laterality Date   TONSILLECTOMY         Home Medications    Prior to Admission medications   Medication Sig Start Date End Date Taking? Authorizing Provider  Aspirin-Acetaminophen-Caffeine (EXCEDRIN EXTRA STRENGTH PO) Take 1-2 tablets by mouth every 8 (eight) hours as needed (for pain).    [provider]  cephALEXin  (KEFLEX ) 500 MG capsule Take 1 capsule (500 mg total) by mouth 4 (four) times daily. 03/30/18   Bernard Drivers, MD  naproxen sodium (ALEVE) 220 MG tablet Take 220-440 mg by mouth 2 (two) times daily as needed (for pain).    [provider]  triamcinolone  (NASACORT ) 55 MCG/ACT AERO nasal inhaler Place 2 sprays into the nose daily. 08/22/17   Purcell Emil Schanz, MD    Family History History reviewed. No pertinent family history.  Social History Social History   Tobacco Use   Smoking status: Every Day    Current packs/day: 0.50    Types: Cigarettes   Smokeless tobacco: Never  Substance Use Topics   Alcohol use: Not Currently   Drug use: No     Allergies   Loratadine, Amoxicillin,  Penicillins, Cefpodoxime, and Penicillins   Review of Systems Review of Systems Per HPI  Physical Exam Triage Vital Signs ED Triage Vitals  Encounter Vitals Group     BP 05/22/24 0931 120/76     Girls Systolic BP Percentile --      Girls Diastolic BP Percentile --      Boys Systolic BP Percentile --      Boys Diastolic BP Percentile --      Pulse Rate 05/22/24 0931 63     Resp 05/22/24 0931 20     Temp 05/22/24 0931 98.4 F (36.9 C)     Temp Source 05/22/24 0931 Oral     SpO2 05/22/24 0931 97 %     Weight --      Height --      Head Circumference --      Peak Flow --      Pain Score 05/22/24 0929 7     Pain Loc --      Pain Education --      Exclude from Growth Chart --    No data found.  Updated Vital Signs BP 120/76 (BP Location: Right Arm)   Pulse 63   Temp 98.4 F (36.9 C) (Oral)   Resp 20   SpO2 97%   Visual Acuity Right Eye Distance:   Left Eye Distance:   Bilateral Distance:  Right Eye Near:   Left Eye Near:    Bilateral Near:     Physical Exam Vitals and nursing note reviewed.  Constitutional:      General: He is not in acute distress.    Appearance: Normal appearance.  HENT:     Head: Normocephalic.     Right Ear: Ear canal and external ear normal. Tympanic membrane is erythematous and bulging.     Left Ear: Tympanic membrane, ear canal and external ear normal.     Nose: Congestion present.     Right Turbinates: Enlarged and swollen.     Left Turbinates: Enlarged and swollen.     Right Sinus: Maxillary sinus tenderness and frontal sinus tenderness present.     Left Sinus: Maxillary sinus tenderness and frontal sinus tenderness present.     Mouth/Throat:     Lips: Pink.     Mouth: Mucous membranes are moist.     Pharynx: Postnasal drip present. No pharyngeal swelling, oropharyngeal exudate, posterior oropharyngeal erythema or uvula swelling.     Comments: Cobblestoning present to posterior oropharynx  Eyes:     Extraocular Movements:  Extraocular movements intact.     Conjunctiva/sclera: Conjunctivae normal.     Pupils: Pupils are equal, round, and reactive to light.  Cardiovascular:     Rate and Rhythm: Normal rate and regular rhythm.     Pulses: Normal pulses.     Heart sounds: Normal heart sounds.  Pulmonary:     Effort: Pulmonary effort is normal.     Breath sounds: Normal breath sounds.  Abdominal:     General: Bowel sounds are normal.     Palpations: Abdomen is soft.     Tenderness: There is no abdominal tenderness.  Musculoskeletal:     Cervical back: Normal range of motion.  Skin:    General: Skin is warm and dry.  Neurological:     General: No focal deficit present.     Mental Status: He is alert and oriented to person, place, and time.  Psychiatric:        Mood and Affect: Mood normal.        Behavior: Behavior normal.      UC Treatments / Results  Labs (all labs ordered are listed, but only abnormal results are displayed) Labs Reviewed - No data to display  EKG   Radiology No results found.  Procedures Procedures (including critical care time)  Medications Ordered in UC Medications - No data to display  Initial Impression / Assessment and Plan / UC Course  I have reviewed the triage vital signs and the nursing notes.  Pertinent labs & imaging results that were available during my care of the patient were reviewed by me and considered in my medical decision making (see chart for details).  On exam, lung sounds are clear throughout, room air sats at 97%.  Patient with a several day history of sinus pressure, more than 5 days, viral testing is not indicated.  He also complains of pain in the right ear, which on exam shows bulging and erythema.  Will treat for acute sinusitis and right otitis media with azithromycin  250 mg and fluticasone  50 mcg nasal spray for nasal congestion.  Supportive care recommendations were provided and discussed with the patient to include fluids, rest,  over-the-counter analgesics, normal saline nasal spray, and warm compresses to the ear.  Discussed indications with patient regarding follow-up.  Patient was in agreement with this plan of care and verbalizes understanding.  All  questions were answered.  Patient stable for discharge.  Work note was provided.   Final Clinical Impressions(s) / UC Diagnoses   Final diagnoses:  None   Discharge Instructions   None    ED Prescriptions   None    PDMP not reviewed this encounter.   Gilmer Etta PARAS, NP 05/22/24 [phone]

## 2024-07-31 ENCOUNTER — Ambulatory Visit: Admitting: Family Medicine

## 2024-07-31 ENCOUNTER — Encounter: Payer: Self-pay | Admitting: Family Medicine

## 2024-07-31 VITALS — BP 127/79 | HR 63 | Temp 97.6°F | Ht 71.0 in | Wt 217.0 lb

## 2024-07-31 DIAGNOSIS — F321 Major depressive disorder, single episode, moderate: Secondary | ICD-10-CM | POA: Diagnosis not present

## 2024-07-31 DIAGNOSIS — Z1329 Encounter for screening for other suspected endocrine disorder: Secondary | ICD-10-CM

## 2024-07-31 DIAGNOSIS — Z114 Encounter for screening for human immunodeficiency virus [HIV]: Secondary | ICD-10-CM

## 2024-07-31 DIAGNOSIS — Z1159 Encounter for screening for other viral diseases: Secondary | ICD-10-CM

## 2024-07-31 DIAGNOSIS — Z130 Encounter for screening for diseases of the blood and blood-forming organs and certain disorders involving the immune mechanism: Secondary | ICD-10-CM

## 2024-07-31 DIAGNOSIS — Z1321 Encounter for screening for nutritional disorder: Secondary | ICD-10-CM

## 2024-07-31 DIAGNOSIS — Z136 Encounter for screening for cardiovascular disorders: Secondary | ICD-10-CM

## 2024-07-31 DIAGNOSIS — Z13228 Encounter for screening for other metabolic disorders: Secondary | ICD-10-CM

## 2024-07-31 LAB — BAYER DCA HB A1C WAIVED: HB A1C (BAYER DCA - WAIVED): 5.1 % (ref 4.8–5.6)

## 2024-07-31 MED ORDER — ESCITALOPRAM OXALATE 10 MG PO TABS: 10.0000 mg | ORAL_TABLET | Freq: Every day | ORAL | 0 refills | Status: DC

## 2024-07-31 NOTE — Patient Instructions (Signed)
 If your symptoms worsen or you have thoughts of suicide/homicide, PLEASE SEEK IMMEDIATE MEDICAL ATTENTION.  You may always call the National Suicide Hotline.  This is available 24 hours a day, 7 days a week.  Their number is: 860 108 4233  Taking the medicine as directed and not missing any doses is one of the best things you can do to treat your depression.  Here are some things to keep in mind:  Side effects (stomach upset, some increased anxiety) may happen before you notice a benefit.  These side effects typically go away over time. Changes to your dose of medicine or a change in medication all together is sometimes necessary Most people need to be on medication at least 6 months Many people will notice an improvement within two weeks but the full effect of the medication can take up to 4-6 weeks Stopping the medication when you start feeling better often results in a return of symptoms Never discontinue your medication without contacting a health care professional first.  Some medications require gradual discontinuation/ taper and can make you sick if you stop them abruptly.  If your symptoms worsen or you have thoughts of suicide/homicide, PLEASE SEEK IMMEDIATE MEDICAL ATTENTION.  You may always call:  National Suicide Hotline: 475 713 4818 [place] Crisis Line: 915-221-4757 Crisis Recovery in [place]: (276)833-9095  These are available 24 hours a day, 7 days a week.

## 2024-07-31 NOTE — Progress Notes (Signed)
 New Patient Office Visit  Patient ID: Charles Cisneros, Male   DOB: Feb 08, 1996 28 y.o. MRN: [id number]  Chief Complaint  Patient presents with   Establish Care   Depression   ADHD   Subjective:     Charles Cisneros presents to establish care  Depression         Discussed the use of AI scribe software for clinical note transcription with the patient, who gave verbal consent to proceed.  History of Present Illness   Charles Cisneros is a 28 year old who presents for establishing care and evaluation of depression and possible ADHD.  Patient denies any significant medical hx.   Depressive symptoms - Depressed mood, has been going on for a while.  - Occasional passive suicidal thoughts.  - No active suicidal ideation - No prior pharmacologic treatment for depression   Inattention and hyperactivity symptoms - No formal diagnosis of ADHD - Patient feels that he has ADHD symptoms - Concern regarding use of stimulant medications due to family history of addiction - Interest in non-stimulant treatment options if necessary  Psychosocial stressors - Fostering a three-month-old nephew with thyroid disease  - Full-time employment - Attending school to become a runner, broadcasting/film/video - Four children in the household       Outpatient Encounter Medications as of 07/31/2024  Medication Sig   escitalopram  (LEXAPRO ) 10 MG tablet Take 1 tablet (10 mg total) by mouth daily.   [DISCONTINUED] Aspirin-Acetaminophen-Caffeine (EXCEDRIN EXTRA STRENGTH PO) Take 1-2 tablets by mouth every 8 (eight) hours as needed (for pain).   [DISCONTINUED] azithromycin  (ZITHROMAX ) 250 MG tablet Take 1 tablet (250 mg total) by mouth daily. Take first 2 tablets together, then 1 every day until finished.   [DISCONTINUED] cephALEXin  (KEFLEX ) 500 MG capsule Take 1 capsule (500 mg total) by mouth 4 (four) times daily.   [DISCONTINUED] fluticasone  (FLONASE ) 50 MCG/ACT nasal spray Place 2 sprays into both nostrils  daily.   [DISCONTINUED] naproxen sodium (ALEVE) 220 MG tablet Take 220-440 mg by mouth 2 (two) times daily as needed (for pain).   [DISCONTINUED] triamcinolone  (NASACORT ) 55 MCG/ACT AERO nasal inhaler Place 2 sprays into the nose daily.   No facility-administered encounter medications on file as of 07/31/2024.    Past Medical History:  Diagnosis Date   Allergy    Depression     Past Surgical History:  Procedure Laterality Date   TONSILLECTOMY      Family History  Problem Relation Age of Onset   Hypertension Father     Social History   Socioeconomic History   Marital status: Married    Spouse name: Not on file   Number of children: Not on file   Years of education: Not on file   Highest education level: Not on file  Occupational History   Not on file  Tobacco Use   Smoking status: Every Day    Current packs/day: 0.50    Average packs/day: 0.5 packs/day for 8.0 years (4.0 ttl pk-yrs)    Types: Cigarettes    Start date: 07/31/2016   Smokeless tobacco: Never  Vaping Use   Vaping status: Never Used  Substance and Sexual Activity   Alcohol use: Not Currently   Drug use: Yes    Types: Marijuana   Sexual activity: Yes    Birth control/protection: Condom  Other Topics Concern   Not on file  Social History Narrative   ** Merged History Encounter **       Social Drivers  of Health   Financial Resource Strain: Not on file  Food Insecurity: Not on file  Transportation Needs: Not on file  Physical Activity: Not on file  Stress: Not on file  Social Connections: Unknown (01/14/2022)   Received from [facility]   Social Network    Social Network: Not on file  Intimate Partner Violence: Unknown (12/06/2021)   Received from [facility]   HITS    Physically Hurt: Not on file    Insult or Talk Down To: Not on file    Threaten Physical Harm: Not on file    Scream or Curse: Not on file    Review of Systems  Psychiatric/Behavioral:  Positive for depression.        Objective:    BP 127/79   Pulse 63   Temp 97.6 F (36.4 C)   Ht 5' 11 (1.803 m)   Wt 217 lb (98.4 kg)   SpO2 97%   BMI 30.27 kg/m   Physical Exam Vitals reviewed.  Constitutional:      Appearance: Normal appearance.  HENT:     Head: Normocephalic and atraumatic.  Eyes:     Extraocular Movements: Extraocular movements intact.     Conjunctiva/sclera: Conjunctivae normal.     Pupils: Pupils are equal, round, and reactive to light.  Cardiovascular:     Rate and Rhythm: Normal rate and regular rhythm.     Pulses: Normal pulses.     Heart sounds: Normal heart sounds. No murmur heard. Pulmonary:     Effort: Pulmonary effort is normal. No respiratory distress.     Breath sounds: Normal breath sounds.  Musculoskeletal:        General: No deformity. Normal range of motion.     Cervical back: Normal range of motion and neck supple.  Skin:    General: Skin is warm and dry.  Neurological:     General: No focal deficit present.     Mental Status: He is alert and oriented to person, place, and time.  Psychiatric:        Mood and Affect: Mood normal.        Behavior: Behavior normal.          Assessment & Plan:   Problem List Items Addressed This Visit   None Visit Diagnoses       Depression, major, single episode, moderate (HCC)    -  Primary   Relevant Medications   escitalopram  (LEXAPRO ) 10 MG tablet     Encounter for screening for HIV       Relevant Orders   HIV antibody (with reflex)     Need for hepatitis C screening test       Relevant Orders   Hepatitis C antibody     Screening for endocrine, nutritional, metabolic and immunity disorder       Relevant Orders   TSH   T4, Free   CBC with Differential   Comprehensive metabolic panel with GFR   Lipid Panel   Bayer DCA Hb A1c Waived     Encounter for screening for cardiovascular disorders       Relevant Orders   Lipid Panel       Assessment and Plan    Establishing care with no significant medical  history.  - Routine screening labs ordered, results pending.  Major depressive disorder, single episode, unspecified - Open to medication but hesitant about counseling. - Discussed potential benefits and side effects of SSRI therapy.  - Discussed black box  warning about SSRIs potentially causing SI. Discussed seeking immediate medical attention for any SI.  - Initiated Lexapro  at 10 mg/day.  - F/u in 2 weeks for reevaluation or sooner for any concerns.    ADHD symptoms - Recommended working on depression symptoms first and reevaluating after getting to a therapeutic dose of SSRI.  - If patient feels that he is still having ADHD symptmos after being treated for depression will send to psychiatry. Patient voiced agreement with the plan.        Return in about 2 weeks (around 08/14/2024) for depression reevaluation .   Charles LELON Severin, FNP [facility]

## 2024-08-01 LAB — LIPID PANEL
Chol/HDL Ratio: 5.7 ratio — ABNORMAL HIGH (ref 0.0–5.0)
Cholesterol, Total: 195 mg/dL (ref 100–199)
HDL: 34 mg/dL — ABNORMAL LOW (ref >39)
LDL Chol Calc (NIH): 125 mg/dL — ABNORMAL HIGH (ref 0–99)
Triglycerides: 200 mg/dL — ABNORMAL HIGH (ref 0–149)
VLDL Cholesterol Cal: 36 mg/dL (ref 5–40)

## 2024-08-01 LAB — COMPREHENSIVE METABOLIC PANEL WITH GFR
ALT: 51 IU/L — ABNORMAL HIGH (ref 0–44)
AST: 35 IU/L (ref 0–40)
Albumin: 4.5 g/dL (ref 4.3–5.2)
Alkaline Phosphatase: 62 IU/L (ref 47–123)
BUN/Creatinine Ratio: 11 (ref 9–20)
BUN: 12 mg/dL (ref 6–20)
Bilirubin Total: 0.5 mg/dL (ref 0.0–1.2)
CO2: 23 mmol/L (ref 20–29)
Calcium: 9.4 mg/dL (ref 8.7–10.2)
Chloride: 102 mmol/L (ref 96–106)
Creatinine, Ser: 1.06 mg/dL (ref 0.76–1.27)
Globulin, Total: 2.3 g/dL (ref 1.5–4.5)
Glucose: 100 mg/dL — ABNORMAL HIGH (ref 70–99)
Potassium: 4.3 mmol/L (ref 3.5–5.2)
Sodium: 139 mmol/L (ref 134–144)
Total Protein: 6.8 g/dL (ref 6.0–8.5)
eGFR: 99 mL/min/1.73 (ref >59)

## 2024-08-01 LAB — CBC WITH DIFFERENTIAL/PLATELET
Basophils Absolute: 0.1 x10E3/uL (ref 0.0–0.2)
Basos: 1 %
EOS (ABSOLUTE): 0.3 x10E3/uL (ref 0.0–0.4)
Eos: 3 %
Hematocrit: 46.7 % (ref 37.5–51.0)
Hemoglobin: 16 g/dL (ref 13.0–17.7)
Immature Grans (Abs): 0 x10E3/uL (ref 0.0–0.1)
Immature Granulocytes: 0 %
Lymphocytes Absolute: 2.2 x10E3/uL (ref 0.7–3.1)
Lymphs: 30 %
MCH: 30.3 pg (ref 26.6–33.0)
MCHC: 34.3 g/dL (ref 31.5–35.7)
MCV: 88 fL (ref 79–97)
Monocytes Absolute: 0.6 x10E3/uL (ref 0.1–0.9)
Monocytes: 8 %
Neutrophils Absolute: 4.3 x10E3/uL (ref 1.4–7.0)
Neutrophils: 58 %
Platelets: 222 x10E3/uL (ref 150–450)
RBC: 5.28 x10E6/uL (ref 4.14–5.80)
RDW: 12.9 % (ref 11.6–15.4)
WBC: 7.4 x10E3/uL (ref 3.4–10.8)

## 2024-08-01 LAB — TSH: TSH: 2.22 u[IU]/mL (ref 0.450–4.500)

## 2024-08-01 LAB — T4, FREE: Free T4: 1.3 ng/dL (ref 0.82–1.77)

## 2024-08-01 LAB — HEPATITIS C ANTIBODY: Hep C Virus Ab: NONREACTIVE

## 2024-08-01 LAB — HIV ANTIBODY (ROUTINE TESTING W REFLEX): HIV Screen 4th Generation wRfx: NONREACTIVE

## 2024-08-06 ENCOUNTER — Ambulatory Visit: Payer: Self-pay | Admitting: Family Medicine

## 2024-08-15 ENCOUNTER — Encounter: Payer: Self-pay | Admitting: Emergency Medicine

## 2024-08-15 ENCOUNTER — Ambulatory Visit
Admission: EM | Admit: 2024-08-15 | Discharge: 2024-08-15 | Disposition: A | Discharge status: Home / Self Care | Attending: Nurse Practitioner | Admitting: Nurse Practitioner

## 2024-08-15 DIAGNOSIS — H109 Unspecified conjunctivitis: Secondary | ICD-10-CM

## 2024-08-15 MED ORDER — ERYTHROMYCIN 5 MG/GM OP OINT: TOPICAL_OINTMENT | OPHTHALMIC | 0 refills | Status: DC

## 2024-08-15 MED ORDER — OLOPATADINE HCL 0.2 % OP SOLN: 1.0000 [drp] | Freq: Two times a day (BID) | OPHTHALMIC | 0 refills | Status: DC

## 2024-08-15 NOTE — Discharge Instructions (Signed)
 Use medication as prescribed.   Cool compresses to the eyes to help with pain or swelling.  You may apply warm compresses for pain or discomfort. Recommend the use of Visine or Clear Eyes eyedrops to use throughout the day to help keep the eyes moist and lubricated. Strict handwashing when applying medication.  Avoid rubbing or manipulating the eyes while symptoms persist. If your symptoms fail to improve, you may follow-up in this clinic or with your primary care physician for further evaluation. Follow-up as needed.

## 2024-08-15 NOTE — ED Triage Notes (Signed)
 Bilateral eye redness and discomfort that started this morning.

## 2024-08-15 NOTE — ED Provider Notes (Signed)
 [facility]    CSN: [id number] Arrival date & time: 08/15/24  9161      History   Chief Complaint No chief complaint on file.   HPI JAHMARI ESBENSHADE is a 28 y.o. male.   The history is provided by the patient.   Patient presents for complaints of bilateral eye redness and swelling in the left eye that started this morning upon wakening.  Patient also states that the eyes feel dry, itchy, and irritated.  He states that he did have some crusting around his eyes earlier this morning.  States that his wife did have the same or similar symptoms and one of her eyes.  He denies light sensitivity, purulent drainage, or visual changes.  Reports underlying history of seasonal allergies, but states that he does not take allergy medication daily.  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  Denies use of contacts or eyeglasses.  Past Medical History:  Diagnosis Date   Allergy    Depression     Patient Active Problem List   Diagnosis Date Noted   Acute upper respiratory infection 08/22/2017   Sinus congestion 08/22/2017   Smoker 06/24/2016    Past Surgical History:  Procedure Laterality Date   TONSILLECTOMY         Home Medications    Prior to Admission medications  Medication Sig Start Date End Date Taking? Authorizing Provider  escitalopram  (LEXAPRO ) 10 MG tablet Take 1 tablet (10 mg total) by mouth daily. 07/31/24   Alcus Oneil ORN, FNP    Family History Family History  Problem Relation Age of Onset   Hypertension Father     Social History Social History[1]   Allergies   Loratadine, Amoxicillin, Penicillins, Cefpodoxime, and Penicillins   Review of Systems Review of Systems Per HPI  Physical Exam Triage Vital Signs ED Triage Vitals  Encounter Vitals Group     BP 08/15/24 0849 (!) 152/93     Girls Systolic BP Percentile --      Girls Diastolic BP Percentile --      Boys Systolic BP Percentile --       Boys Diastolic BP Percentile --      Pulse Rate 08/15/24 0849 98     Resp 08/15/24 0849 18     Temp 08/15/24 0849 98.5 F (36.9 C)     Temp Source 08/15/24 0849 Oral     SpO2 08/15/24 0849 94 %     Weight --      Height --      Head Circumference --      Peak Flow --      Pain Score 08/15/24 0851 4     Pain Loc --      Pain Education --      Exclude from Growth Chart --    No data found.  Updated Vital Signs BP (!) 152/93 (BP Location: Right Arm)   Pulse 98   Temp 98.5 F (36.9 C) (Oral)   Resp 18   SpO2 94%   Visual Acuity Right Eye Distance: 20/20 Left Eye Distance: 20/20 Bilateral Distance: 20/20  Right Eye Near:   Left Eye Near:    Bilateral Near:     Physical Exam Vitals and nursing note reviewed.  Constitutional:      General: He is not in acute distress.    Appearance: Normal appearance.  HENT:     Head: Normocephalic.  Right Ear: Tympanic membrane, ear canal and external ear normal.     Left Ear: Tympanic membrane, ear canal and external ear normal.     Nose: Nose normal.     Mouth/Throat:     Mouth: Mucous membranes are moist.  Eyes:     General: Vision grossly intact. Allergic shiner present. No visual field deficit.    Extraocular Movements: Extraocular movements intact.     Right eye: Normal extraocular motion and no nystagmus.     Left eye: Normal extraocular motion and no nystagmus.     Conjunctiva/sclera:     Right eye: Right conjunctiva is injected.     Left eye: Left conjunctiva is injected.     Pupils: Pupils are equal, round, and reactive to light.  Cardiovascular:     Rate and Rhythm: Normal rate and regular rhythm.     Pulses: Normal pulses.     Heart sounds: Normal heart sounds.  Musculoskeletal:     Cervical back: Normal range of motion.  Skin:    General: Skin is warm and dry.  Neurological:     General: No focal deficit present.     Mental Status: He is alert and oriented to person, place, and time.  Psychiatric:         Mood and Affect: Mood normal.        Behavior: Behavior normal.      UC Treatments / Results  Labs (all labs ordered are listed, but only abnormal results are displayed) Labs Reviewed - No data to display  EKG   Radiology No results found.  Procedures Procedures (including critical care time)  Medications Ordered in UC Medications - No data to display  Initial Impression / Assessment and Plan / UC Course  I have reviewed the triage vital signs and the nursing notes.  Pertinent labs & imaging results that were available during my care of the patient were reviewed by me and considered in my medical decision making (see chart for details).  Patient presents for complaints of bilateral eye redness and irritation after waking this morning.  On exam, he does have injection of the bilateral eyes.  No obvious mucopurulent drainage is present.  Symptoms are consistent with allergic conjunctivitis; however, given the high risk for contagion, we will treat with Pataday eyedrops, and will also cover with erythromycin ointment for any bacterial etiology.  Supportive care recommendations were provided and discussed with the patient to include over-the-counter analgesics, cool compresses to the eyes, avoiding rubbing or manipulating the eyes, and strict hand hygiene.  Also recommended beginning allergy medications while symptoms persist.  Discussed indications with the patient regarding follow-up.  Patient was in agreement with this plan of care and verbalizes understanding.  All questions were answered.  Patient stable for discharge.  Work note was provided.  Final Clinical Impressions(s) / UC Diagnoses   Final diagnoses:  None   Discharge Instructions   None    ED Prescriptions   None    PDMP not reviewed this encounter.     [1]  Social History Tobacco Use   Smoking status: Every Day    Current packs/day: 0.50    Average packs/day: 0.5 packs/day for 8.0 years (4.0 ttl  pk-yrs)    Types: Cigarettes    Start date: 07/31/2016   Smokeless tobacco: Never  Vaping Use   Vaping status: Never Used  Substance Use Topics   Alcohol use: Not Currently   Drug use: Yes    Types: Marijuana  Gilmer Etta PARAS, NP 08/15/24 [phone]

## 2024-08-16 ENCOUNTER — Ambulatory Visit: Admitting: Family Medicine

## 2024-09-06 ENCOUNTER — Ambulatory Visit: Admitting: Family Medicine

## 2024-09-06 VITALS — BP 116/77 | HR 89 | Temp 98.4°F | Ht 71.0 in | Wt 219.0 lb

## 2024-09-06 DIAGNOSIS — E7800 Pure hypercholesterolemia, unspecified: Secondary | ICD-10-CM | POA: Diagnosis not present

## 2024-09-06 DIAGNOSIS — Z6830 Body mass index (BMI) 30.0-30.9, adult: Secondary | ICD-10-CM

## 2024-09-06 DIAGNOSIS — Z0001 Encounter for general adult medical examination with abnormal findings: Secondary | ICD-10-CM

## 2024-09-06 DIAGNOSIS — N509 Disorder of male genital organs, unspecified: Secondary | ICD-10-CM | POA: Diagnosis not present

## 2024-09-06 DIAGNOSIS — F320 Major depressive disorder, single episode, mild: Secondary | ICD-10-CM | POA: Diagnosis not present

## 2024-09-06 DIAGNOSIS — Z0000 Encounter for general adult medical examination without abnormal findings: Secondary | ICD-10-CM

## 2024-09-06 DIAGNOSIS — R7401 Elevation of levels of liver transaminase levels: Secondary | ICD-10-CM

## 2024-09-06 MED ORDER — ESCITALOPRAM OXALATE 20 MG PO TABS: 20.0000 mg | ORAL_TABLET | Freq: Every day | ORAL | 5 refills | Status: AC

## 2024-09-06 NOTE — Progress Notes (Signed)
 "  Complete physical exam  Patient: Charles Cisneros    DOB: Jun 23, 1996 29 y.o.   MRN: [id number]  Chief Complaint  Patient presents with   Follow-up    BP Follow Up and Med Refill   Annual Exam    Subjective:    Charles Cisneros is a 29 y.o. male who presents today for a complete physical exam. He reports consuming a general diet. Plans on increasing exercise He generally feels well. He reports sleeping well. He does have additional problems to discuss today.   Discussed the use of AI scribe software for clinical note transcription with the patient, who gave verbal consent to proceed.  History of Present Illness   Charles Cisneros is a 29 year old who presents for follow-up of depressive and anxiety symptoms and yearly physical.   Patient also has skin problem to his scrotum.   Depressive symptoms - Started Lexapro  10 mg/day on 07/31/24 - Patient feels like there have been some minimal positive improvements in symptoms - No reported side effects from Lexapro , including no stomach problems or suicidal thoughts  Reports hx of feeling Anxiety symptoms in the past - Symptoms are situational rather than generalized per patient.  - Notable trigger includes a difficult boss at his last job - Anxiety has improved over time, possibly due to aging and development of positive coping mechanisms  Scrotum bumps - Patient reports he has had white bumps on his scrotum since about 6 grade. - These bumps sometimes open and drain. - Denies having these bumps anywhere else on his body - Patient was on Accutane for acne when he was younger but this has since cleared up      Most recent fall risk assessment:    09/06/2024    3:48 PM  Fall Risk   Falls in the past year? 0  Number falls in past yr: 0  Injury with Fall? 1  Risk for fall due to : No Fall Risks  Follow up Falls evaluation completed     Most recent depression screenings:    09/06/2024    3:48 PM 07/31/2024     8:07 AM  PHQ 2/9 Scores  PHQ - 2 Score 2 4  PHQ- 9 Score 7 20       Patient Care Team: Alcus Oneil ORN, FNP as PCP - General (Family Medicine) Patient, No Pcp Per (General Practice)   ROS    Objective:    BP 116/77   Pulse 89   Temp 98.4 F (36.9 C)   Ht 5' 11 (1.803 m)   Wt 219 lb (99.3 kg)   SpO2 97%   BMI 30.54 kg/m    Physical Exam Vitals reviewed.  Constitutional:      Appearance: Normal appearance.  HENT:     Head: Normocephalic and atraumatic.     Right Ear: Tympanic membrane, ear canal and external ear normal.     Left Ear: Tympanic membrane, ear canal and external ear normal.     Nose: Nose normal.     Mouth/Throat:     Mouth: Mucous membranes are moist.     Pharynx: Oropharynx is clear.  Eyes:     Extraocular Movements: Extraocular movements intact.     Conjunctiva/sclera: Conjunctivae normal.     Pupils: Pupils are equal, round, and reactive to light.  Cardiovascular:     Rate and Rhythm: Normal rate and regular rhythm.     Pulses: Normal pulses.  Heart sounds: Normal heart sounds. No murmur heard. Pulmonary:     Effort: Pulmonary effort is normal. No respiratory distress.     Breath sounds: Normal breath sounds.  Abdominal:     General: There is no distension.     Palpations: Abdomen is soft. There is no mass.     Tenderness: There is no abdominal tenderness.  Genitourinary:    Penis: Normal.      Comments: Small white papules scattered to the scrotum Musculoskeletal:        General: No deformity. Normal range of motion.     Cervical back: Normal range of motion and neck supple.     Comments: Spine straight   Skin:    General: Skin is warm and dry.  Neurological:     General: No focal deficit present.     Mental Status: He is alert and oriented to person, place, and time.  Psychiatric:        Mood and Affect: Mood normal.        Behavior: Behavior normal.       No results found for any visits on 09/06/24.      Assessment &  Plan:    Routine Health Maintenance and Physical Exam Immunization History  Administered Date(s) Administered   DTaP 04/03/1997, 06/05/1997, 10/23/1997, 07/06/1998   Fluad Trivalent(High Dose 65+) 09/12/2012   HIB (PRP-OMP) 04/03/1997, 06/05/1997, 07/06/1998   Hepatitis B, PED/ADOLESCENT 02/03/1997, 04/03/1997, 10/23/1997   IPV 04/03/1997, 06/05/1997, 07/06/1998   Influenza,inj,Quad PF,6+ Mos 06/03/2019   MMR 07/06/1998   Td 12/20/2007   Tdap 06/24/2016   Varicella 07/06/1998    Health Maintenance  Topic Date Due   COVID-19 Vaccine (1 - 2025-26 season) 09/22/2024 (Originally 05/06/2024)   Influenza Vaccine  12/03/2024 (Originally 04/05/2024)   Pneumococcal Vaccine (1 of 2 - PCV) 09/06/2025 (Originally 01/29/2016)   HPV VACCINES (1 - 3-dose SCDM series) 09/06/2025 (Originally 01/29/2024)   DTaP/Tdap/Td (7 - Td or Tdap) 06/24/2026   Hepatitis B Vaccines 19-59 Average Risk  Completed   Hepatitis C Screening  Completed   HIV Screening  Completed   Meningococcal B Vaccine  Aged Out    Discussed health benefits of physical activity, and encouraged him to engage in regular exercise appropriate for his age and condition.  Problem List Items Addressed This Visit   None Visit Diagnoses       Encounter for routine history and physical exam for male    -  Primary     Depression, major, single episode, mild       Relevant Medications   escitalopram  (LEXAPRO ) 20 MG tablet     Lesion of skin of scrotum       Relevant Orders   Ambulatory referral to Dermatology     Elevated ALT measurement         Elevated cholesterol         BMI 30.0-30.9,adult           Assessment and Plan    Major depressive disorder, single episode, mild Partial improvement on Lexapro  10 mg per patient. No side effects. - PHQ-9 score 7 today versus 20 at the last visit - Increased Lexapro  to 20 mg daily. - Scheduled follow-up in three months unless symptoms worsen or do not continue to improve.      Scrotum  bumps - Placed referral to dermatology for further evaluation.   Lab work  - Lab work obtained on 07/31/2024 showed an ALT of 51 with negative hep C,  a lipid panel with elevated triglycerides and LDL and decreased HDL.  Patient states that he plans on starting to eat healthier and increase his physical activity which will hopefully help these metrics.  Discussed with patient rechecking these labs at his next appointment.    Return in about 3 months (around 12/05/2024).    Oneil LELON Severin, FNP [facility]    "

## 2025-03-06 ENCOUNTER — Ambulatory Visit: Admitting: Family Medicine

## 2025-05-14 ENCOUNTER — Ambulatory Visit: Admitting: Physician Assistant
